# Patient Record
Sex: Male | Born: 1995 | Race: White | Hispanic: No | Marital: Single | Smoking: Never smoker
Health system: Southern US, Community
[De-identification: ages and names within clinical notes are randomized; demographics above are authoritative.]

---
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
## 2017-01-01 IMAGING — CT CT HEAD W/O CM
1 series · 16 of 30 positions shown, 20 images · non-contrast
Comparison: 05/05/2009

CLINICAL DATA: Diffuse headache with dizziness since yesterday.

EXAM:
CT HEAD WITHOUT CONTRAST
TECHNIQUE: Contiguous axial images were obtained from the base of the skull
through the vertex without intravenous contrast.

[Series 2: headseq 4.8 h37s · axial · 0.43mm/px · z∈[+1309,+1461]mm · 16 of 36 slices shown, 20 images]
[im 2/36  brain]
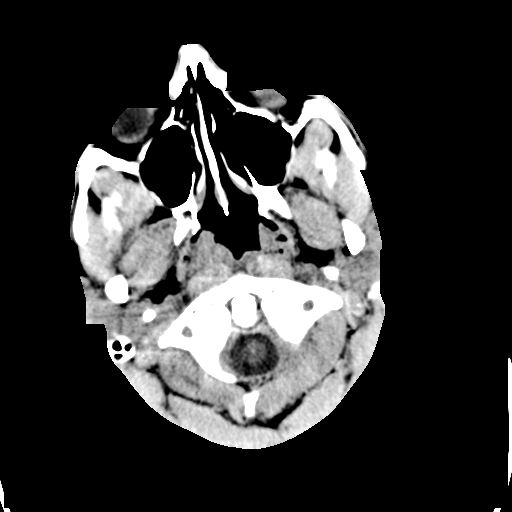
[im 2/36  bone]
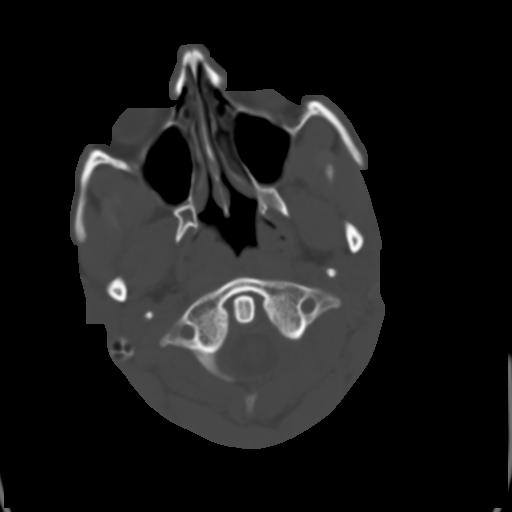
[im 4/36  brain]
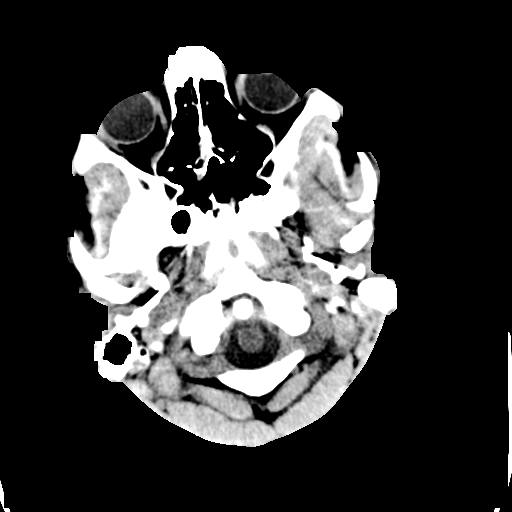
[im 7/36  brain]
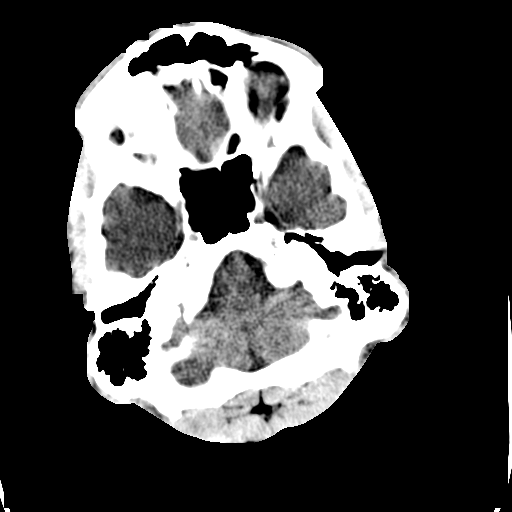
[im 9/36  brain]
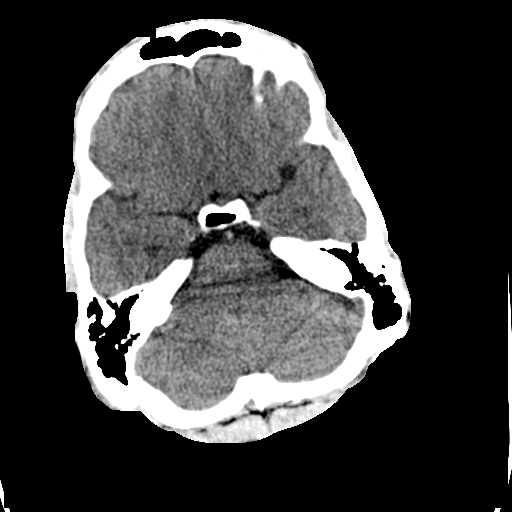
[im 10/36  brain]
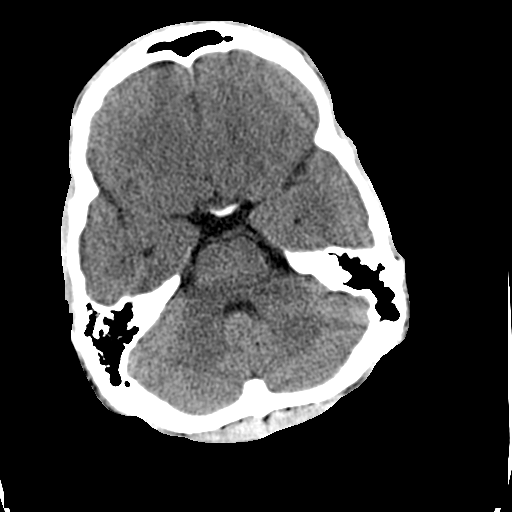
[im 10/36  bone]
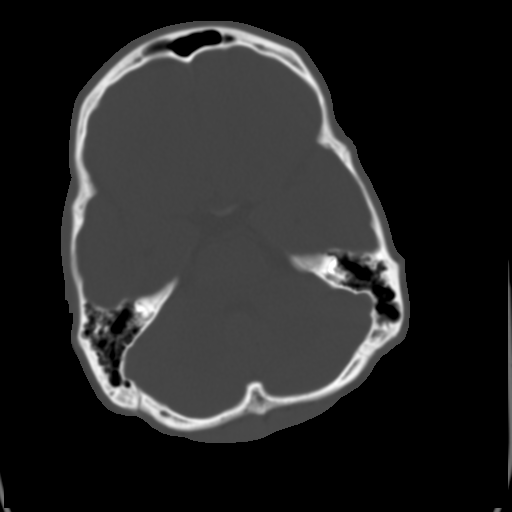
[im 13/36  brain]
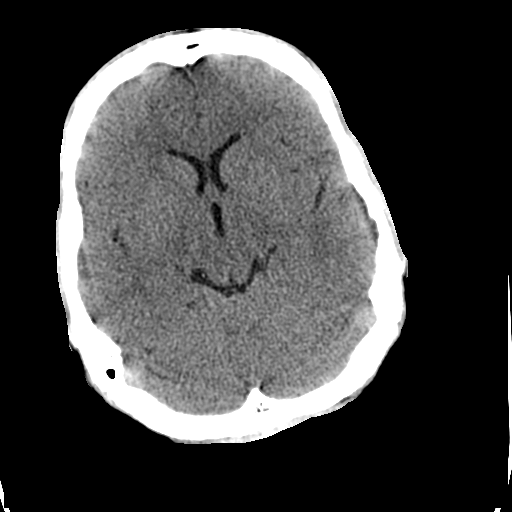
[im 15/36  brain]
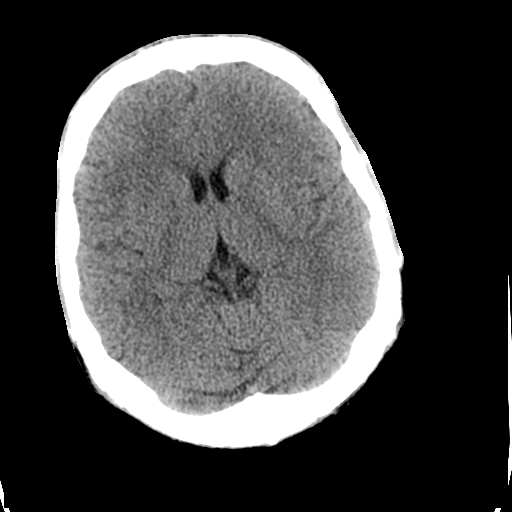
[im 17/36  brain]
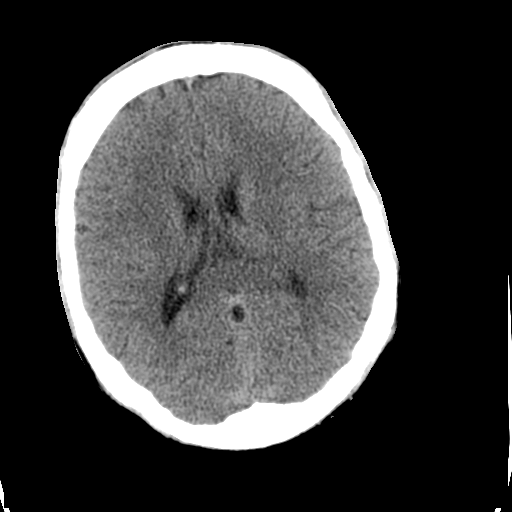
[im 19/36  brain]
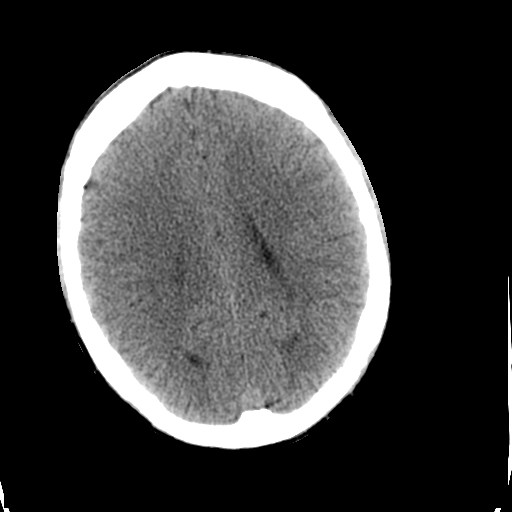
[im 19/36  bone]
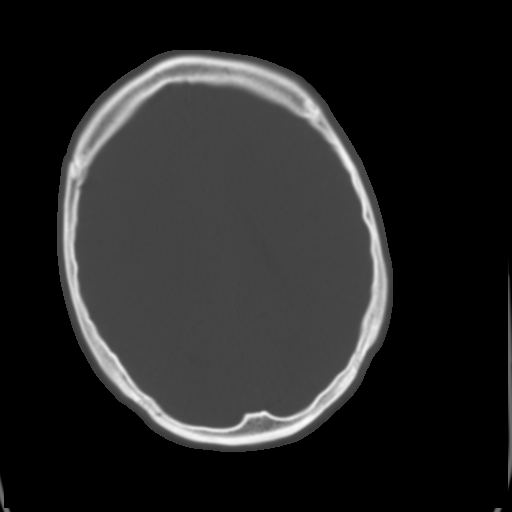
[im 21/36  brain]
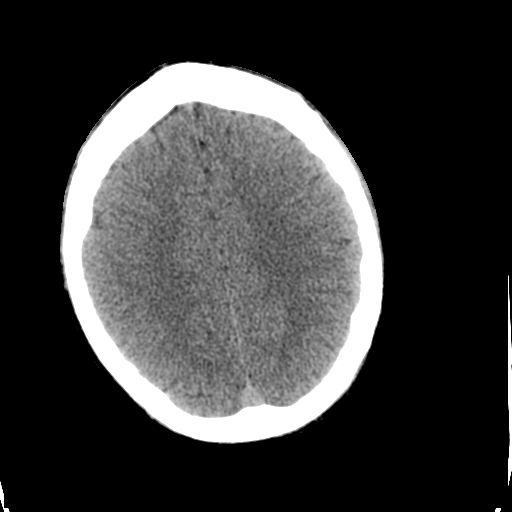
[im 23/36  brain]
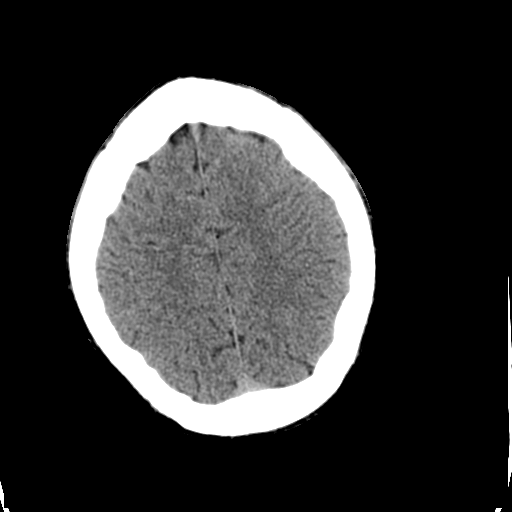
[im 26/36  brain]
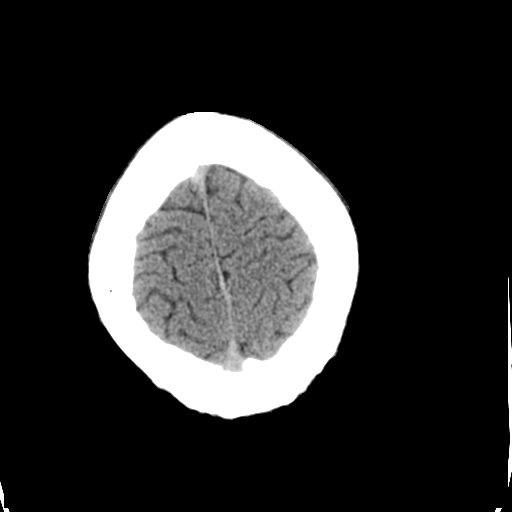
[im 27/36  brain]
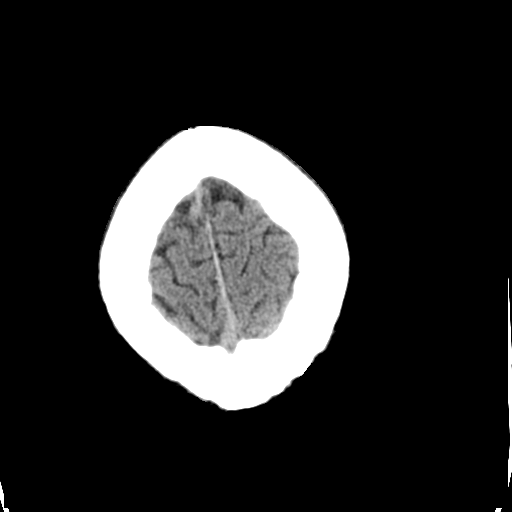
[im 27/36  bone]
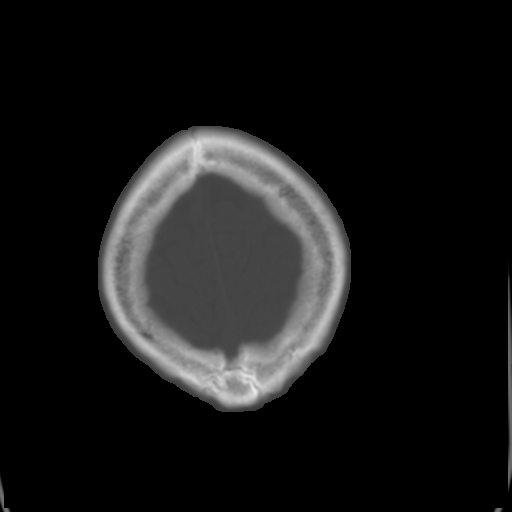
[im 29/36  brain]
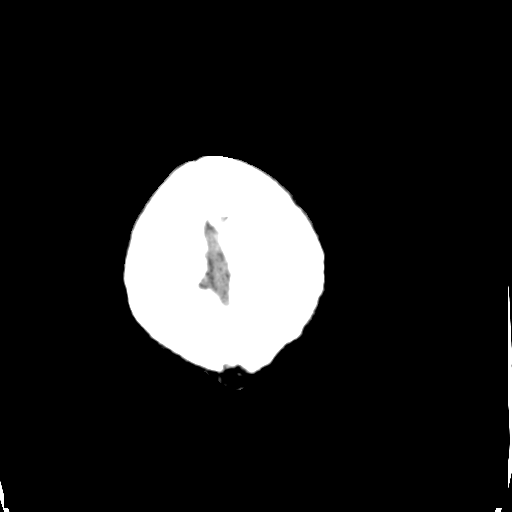
[im 32/36  brain]
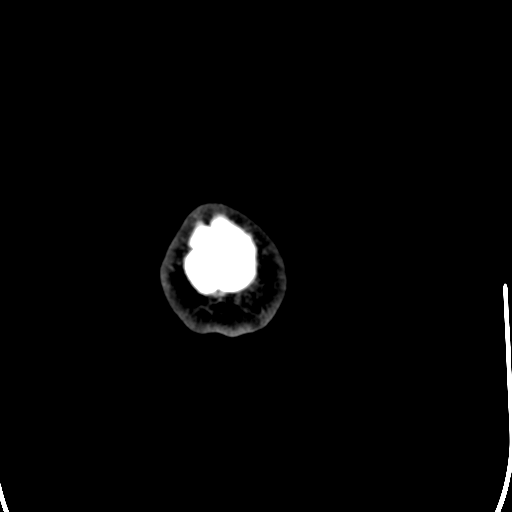
[im 34/36  brain]
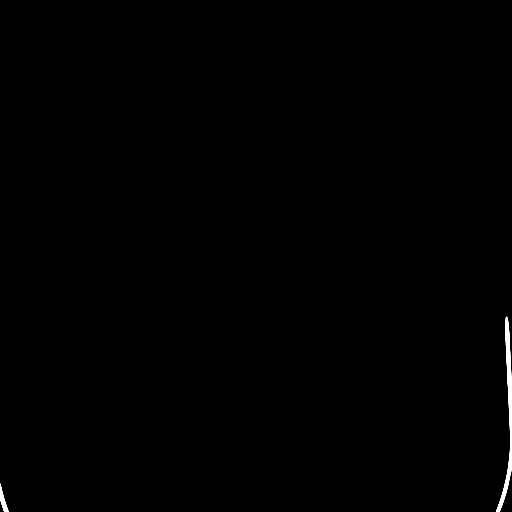

[16 of 30 positions shown; findings below may reference images not displayed]

FINDINGS: There is no evidence of acute cortical infarct, intracranial
hemorrhage, mass, midline shift, or extra-axial fluid collection.
Ventricles and sulci are normal. There is likely mild cerebellar
tonsillar ectopia.

Visualized orbits are unremarkable. Visualized paranasal sinuses and
mastoid air cells are clear. No acute osseous abnormality.
IMPRESSION: 1. No evidence of acute intracranial abnormality.
2. Suspect mild cerebellar tonsillar ectopia.
